# Patient Record
Sex: Female | Born: 1970 | Race: Black or African American | Hispanic: No | Marital: Married | Smoking: Current every day smoker
Health system: Southern US, Community
[De-identification: ages and names within clinical notes are randomized; demographics above are authoritative.]

## PROBLEM LIST (undated history)

## (undated) DIAGNOSIS — I10 Essential (primary) hypertension: Secondary | ICD-10-CM

## (undated) DIAGNOSIS — F329 Major depressive disorder, single episode, unspecified: Secondary | ICD-10-CM

## (undated) DIAGNOSIS — F419 Anxiety disorder, unspecified: Secondary | ICD-10-CM

## (undated) DIAGNOSIS — F32A Depression, unspecified: Secondary | ICD-10-CM

## (undated) HISTORY — PX: EYE SURGERY: SHX253

## (undated) HISTORY — PX: CHOLECYSTECTOMY: SHX55

---

## 1999-10-10 ENCOUNTER — Inpatient Hospital Stay (HOSPITAL_COMMUNITY): Admission: AD | Admit: 1999-10-10 | Discharge: 1999-10-10 | Payer: Self-pay | Admitting: *Deleted

## 2006-12-20 ENCOUNTER — Emergency Department (HOSPITAL_COMMUNITY): Admission: EM | Admit: 2006-12-20 | Discharge: 2006-12-20 | Payer: Self-pay | Admitting: Emergency Medicine

## 2009-11-17 ENCOUNTER — Emergency Department (HOSPITAL_COMMUNITY): Admission: EM | Admit: 2009-11-17 | Discharge: 2009-11-17 | Payer: Self-pay | Admitting: Emergency Medicine

## 2013-03-10 ENCOUNTER — Encounter (HOSPITAL_COMMUNITY): Payer: Self-pay | Admitting: Emergency Medicine

## 2013-03-10 ENCOUNTER — Emergency Department (INDEPENDENT_AMBULATORY_CARE_PROVIDER_SITE_OTHER)
Admission: EM | Admit: 2013-03-10 | Discharge: 2013-03-10 | Disposition: A | Discharge status: Home / Self Care | Payer: Self-pay | Source: Home / Self Care | Attending: Family Medicine | Admitting: Family Medicine

## 2013-03-10 DIAGNOSIS — R42 Dizziness and giddiness: Secondary | ICD-10-CM

## 2013-03-10 NOTE — ED Provider Notes (Signed)
Ghadeer Kastelic is a 42 y.o. female who presents to Urgent Care today for lightheadedness for the past 3 days. Patient notes a feeling as though she may pass out especially when she stands up. She denies any vertigo actual syncope chest pain palpitations shortness of breath. She does not a mild headache and nausea but feels well otherwise. She notes that her blood pressure has been elevated recently at home. She does not take blood pressure medications usually is her blood pressure usually well-controlled. She feels well otherwise with no weakness numbness loss of coordination difficulty swallowing or speaking and no vision changes.   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    History reviewed. No pertinent past medical history. History  Substance Use Topics  . Smoking status: Current Every Day Smoker  . Smokeless tobacco: Not on file  . Alcohol Use: No   ROS as above Medications reviewed. No current facility-administered medications for this encounter.   No current outpatient prescriptions on file.    Exam:  BP 156/90  Pulse 74  Temp(Src) 98.3 F (36.8 C) (Oral)  Resp 18  SpO2 97%  LMP 03/03/2013 Filed Vitals:   03/10/13 0907 03/10/13 0925 03/10/13 0926 03/10/13 0927  BP: 156/90 135/86 151/98 142/92  Pulse: 74 66 85 81  Temp: 98.3 F (36.8 C)     TempSrc: Oral     Resp: 18     SpO2: 97%       Gen: Well NAD HEENT: EOMI right. Abnormal eye movements left due to strabismus. PERRLA. No nystagmus Lungs: CTABL Nl WOB Heart: RRR no MRG Abd: NABS, NT, ND Exts: Non edematous BL  LE, warm and well perfused.  Neuro: Alert and oriented cranial nerves II through XII are intact normal coordination sensation and strength. Balance is normal gait is normal.  No results found for this or any previous visit (from the past 24 hour(s)). No results found.  Assessment and Plan: 42 y.o. female with dizziness. Patient has some  indications of orthostatic vital signs. Her rate increases by 15 beats per minute from laying to standing but her blood pressure increases. Her physical exam is largely normal. 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 Am doubtful stroke or other serious etiology to explain her symptoms. Plan for reassurance and oral hydration. [facility] Discussed warning signs or symptoms. Please see discharge instructions. Patient expresses understanding.      Rodolph Bong, MD 03/10/13 [phone]

## 2013-03-10 NOTE — ED Notes (Signed)
Pt c/o dizziness onset 3 days Reports she feels like "passing out" but denies syncope Sxs also include: mild HA, nauseas BP at home has been elevated Alert w/no signs of acute distress.

## 2013-05-31 ENCOUNTER — Emergency Department (HOSPITAL_COMMUNITY)
Admission: EM | Admit: 2013-05-31 | Discharge: 2013-05-31 | Disposition: A | Discharge status: Home / Self Care | Payer: Self-pay | Attending: Emergency Medicine | Admitting: Emergency Medicine

## 2013-05-31 ENCOUNTER — Emergency Department (HOSPITAL_COMMUNITY): Payer: Self-pay

## 2013-05-31 ENCOUNTER — Encounter (HOSPITAL_COMMUNITY): Payer: Self-pay | Admitting: Emergency Medicine

## 2013-05-31 DIAGNOSIS — Y929 Unspecified place or not applicable: Secondary | ICD-10-CM | POA: Insufficient documentation

## 2013-05-31 DIAGNOSIS — M1990 Unspecified osteoarthritis, unspecified site: Secondary | ICD-10-CM

## 2013-05-31 DIAGNOSIS — X500XXA Overexertion from strenuous movement or load, initial encounter: Secondary | ICD-10-CM | POA: Insufficient documentation

## 2013-05-31 DIAGNOSIS — Y939 Activity, unspecified: Secondary | ICD-10-CM | POA: Insufficient documentation

## 2013-05-31 DIAGNOSIS — IMO0002 Reserved for concepts with insufficient information to code with codable children: Secondary | ICD-10-CM | POA: Insufficient documentation

## 2013-05-31 DIAGNOSIS — F17200 Nicotine dependence, unspecified, uncomplicated: Secondary | ICD-10-CM | POA: Insufficient documentation

## 2013-05-31 DIAGNOSIS — R269 Unspecified abnormalities of gait and mobility: Secondary | ICD-10-CM | POA: Insufficient documentation

## 2013-05-31 DIAGNOSIS — M1710 Unilateral primary osteoarthritis, unspecified knee: Secondary | ICD-10-CM | POA: Insufficient documentation

## 2013-05-31 DIAGNOSIS — S8990XA Unspecified injury of unspecified lower leg, initial encounter: Secondary | ICD-10-CM | POA: Insufficient documentation

## 2013-05-31 MED ORDER — NAPROXEN 500 MG PO TABS: 500.0000 mg | ORAL_TABLET | Freq: Two times a day (BID) | ORAL | Status: DC

## 2013-05-31 MED ORDER — NAPROXEN 500 MG PO TABS
500.0000 mg | ORAL_TABLET | Freq: Once | ORAL | Status: AC
  Administered 2013-05-31: 500 mg via ORAL
  Filled 2013-05-31: qty 1

## 2013-05-31 NOTE — ED Provider Notes (Signed)
CSN: [id number]     Arrival date & time 05/31/13  1741 History  This chart was scribed for Earley Favor, NP, working with Flint Melter, MD by Blanchard Kelch, ED Scribe. This patient was seen in room WTR8/WTR8 and the patient's care was started at 8:20 PM.    Chief Complaint  Patient presents with  . Knee Injury  . Knee Pain   Patient is a 42 y.o. female presenting with knee pain. The history is provided by the patient. No language interpreter was used.  Knee Pain Location:  Knee Time since incident:  6 days Injury: yes   Knee location:  R knee Pain details:    Radiates to:  Does not radiate   Severity:  Moderate   Onset quality:  Sudden   Duration:  6 days   Timing:  Constant   Progression:  Unchanged Chronicity:  New Dislocation: no   Foreign body present:  No foreign bodies Relieved by:  Immobilization Worsened by:  Activity Ineffective treatments:  Elevation, heat, ice, NSAIDs and immobilization Associated symptoms: no fever, no muscle weakness, no numbness, no stiffness, no swelling and no tingling     HPI Comments: Galadriel Shroff is a 42 y.o. female who presents to the Emergency Department due to a right knee injury that occurred six days ago on 05/25/13. She states that she twisted the knee the wrong way. She is complaining of constant, unchanged pain to the affected area since the accident occurred. The pain does not radiate. She has been icing the area, wrapping it, using icy hot, using a knee splint and elevating the knee without relief. She has also been taking anti-inflammatory medication intermittently with mild relief. She has been ambulating with a limp due to the pain. She denies taking any medication on a daily basis. She is not allergic to any medications that she knows of.     History reviewed. No pertinent past medical history. Past Surgical History  Procedure Laterality Date  . Cholecystectomy    . Eye surgery     No family history on file. History   Substance Use Topics  . Smoking status: Current Every Day Smoker    Types: Cigarettes  . Smokeless tobacco: Not on file  . Alcohol Use: No   OB History   Grav Para Term Preterm Abortions TAB SAB Ect Mult Living                 Review of Systems  Unable to perform ROS Constitutional: Negative for fever.  Respiratory: Negative for shortness of breath.   Cardiovascular: Negative for leg swelling.  Musculoskeletal: Positive for arthralgias, gait problem and joint swelling. Negative for stiffness.  Skin: Negative for wound.  All other systems reviewed and are negative.    Allergies  Review of patient's allergies indicates no known allergies.  Home Medications   Current Outpatient Rx  Name  Route  Sig  Dispense  Refill  . naproxen (NAPROSYN) 500 MG tablet   Oral   Take 1 tablet (500 mg total) by mouth 2 (two) times daily with a meal.   60 tablet   0    Triage Vitals: BP 143/87  Pulse 86  Temp(Src) 98.6 F (37 C) (Oral)  Resp 20  SpO2 96%  LMP 05/10/2013  Physical Exam  Nursing note and vitals reviewed. Constitutional: She is oriented to person, place, and time. She appears well-developed and well-nourished.  HENT:  Head: Normocephalic and atraumatic.  Eyes: Pupils are equal, round,  and reactive to light.  Neck: Normal range of motion.  Cardiovascular: Normal rate and regular rhythm.   Pulmonary/Chest: Effort normal.  Musculoskeletal: She exhibits edema and tenderness.  Swelling in lateral anterior portion of right knee. With full extension there is pain in anterior patella region. Patella is freely mobile.   Neurological: She is alert and oriented to person, place, and time.  Skin: Skin is warm and dry.  Psychiatric: She has a normal mood and affect.    ED Course  Procedures (including critical care time)  DIAGNOSTIC STUDIES: Oxygen Saturation is 96% on room air, adequate by my interpretation.    COORDINATION OF CARE: 8:24 PM -Will refer patient to  orthopedics for follow up. Will place knee in ace bandage and brace until follow up occurs. Patient verbalizes understanding and agrees with treatment plan.    Labs Review Labs Reviewed - No data to display Imaging Review Dg Knee Complete 4 Views Right  05/31/2013   CLINICAL DATA:  Knee pain. Knee injury. Anterior lateral right knee pain.  EXAM: RIGHT KNEE - COMPLETE 4+ VIEW  COMPARISON:  None.  FINDINGS: There is a corticated chronic appearing calcification in the region of the distal quadriceps tendon. Moderate patellofemoral osteoarthritis. Moderate lateral compartment osteoarthritis. Marginal osteophytes are present. 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 There is no effusion or acute osseous abnormality. The alignment of the knee is anatomic.  IMPRESSION: Lateral and patellofemoral compartment osteoarthritis. No acute osseous abnormality.   Electronically Signed   By: Andreas Newport M.D.   On: 05/31/2013 19:44   Dg Foot Complete Right  05/31/2013   CLINICAL DATA:  Twisted leg, knee popped, lateral foot pain  EXAM: RIGHT FOOT COMPLETE - 3+ VIEW  COMPARISON:  None  FINDINGS: Osseous mineralization normal.  Joint spaces preserved.  No fracture, dislocation, or bone destruction.  IMPRESSION: Normal exam.   Electronically Signed   By: Ulyses Southward M.D.   On: 05/31/2013 19:37    EKG Interpretation   None       MDM   1. Osteoarthritis    Your basis.  Patient will be placed in a knee immobilizer, one week after which time she can use the knee support, and followup with orthopedics. I personally performed the services described in this documentation, which was scribed in my presence. The recorded information has been reviewed and is accurate.   Arman Filter, NP 05/31/13 [phone]

## 2013-05-31 NOTE — ED Provider Notes (Signed)
Medical screening examination/treatment/procedure(s) were performed by non-physician practitioner and as supervising physician I was immediately available for consultation/collaboration.  Flint Melter, MD 05/31/13 [phone]

## 2013-05-31 NOTE — ED Notes (Signed)
Pt states that on 12/4 she injured her right knee at work by twisting the wrong way and been treating it herself with ice packs, icey/hot, knee splint. But pain has just gotten to point she wants to be seen for it and now right foot hurting and she cant put full weight on that extremity.

## 2016-04-03 ENCOUNTER — Encounter (HOSPITAL_COMMUNITY): Payer: Self-pay

## 2016-04-03 ENCOUNTER — Emergency Department (HOSPITAL_COMMUNITY)
Admission: EM | Admit: 2016-04-03 | Discharge: 2016-04-03 | Disposition: A | Discharge status: Home / Self Care | Payer: No Typology Code available for payment source | Attending: Emergency Medicine | Admitting: Emergency Medicine

## 2016-04-03 ENCOUNTER — Emergency Department (HOSPITAL_COMMUNITY): Payer: No Typology Code available for payment source

## 2016-04-03 DIAGNOSIS — Z79899 Other long term (current) drug therapy: Secondary | ICD-10-CM | POA: Insufficient documentation

## 2016-04-03 DIAGNOSIS — F17210 Nicotine dependence, cigarettes, uncomplicated: Secondary | ICD-10-CM | POA: Insufficient documentation

## 2016-04-03 DIAGNOSIS — Y92410 Unspecified street and highway as the place of occurrence of the external cause: Secondary | ICD-10-CM | POA: Diagnosis not present

## 2016-04-03 DIAGNOSIS — Y939 Activity, unspecified: Secondary | ICD-10-CM | POA: Insufficient documentation

## 2016-04-03 DIAGNOSIS — M542 Cervicalgia: Secondary | ICD-10-CM | POA: Diagnosis not present

## 2016-04-03 DIAGNOSIS — I10 Essential (primary) hypertension: Secondary | ICD-10-CM | POA: Insufficient documentation

## 2016-04-03 DIAGNOSIS — Y999 Unspecified external cause status: Secondary | ICD-10-CM | POA: Diagnosis not present

## 2016-04-03 DIAGNOSIS — M545 Low back pain: Secondary | ICD-10-CM | POA: Diagnosis not present

## 2016-04-03 HISTORY — DX: Essential (primary) hypertension: I10

## 2016-04-03 HISTORY — DX: Major depressive disorder, single episode, unspecified: F32.9

## 2016-04-03 HISTORY — DX: Depression, unspecified: F32.A

## 2016-04-03 HISTORY — DX: Anxiety disorder, unspecified: F41.9

## 2016-04-03 MED ORDER — ORPHENADRINE CITRATE ER 100 MG PO TB12: 100.0000 mg | ORAL_TABLET | Freq: Two times a day (BID) | ORAL | 0 refills | Status: DC

## 2016-04-03 MED ORDER — IBUPROFEN 800 MG PO TABS: 800.0000 mg | ORAL_TABLET | Freq: Three times a day (TID) | ORAL | 0 refills | Status: DC

## 2016-04-03 MED ORDER — HYDROCODONE-ACETAMINOPHEN 5-325 MG PO TABS: 1.0000 | ORAL_TABLET | Freq: Four times a day (QID) | ORAL | 0 refills | Status: DC | PRN
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
## 2016-04-03 NOTE — Discharge Instructions (Signed)
Medications: Norco, Norflex, ibuprofen  Treatment: Take 1-2 Norco every 4-6 hours as needed for severe pain. Take Norflex every hours as needed for muscle pain and soreness. Do not drive or operate machinery when taking these medications. If you have to drive and for mild-moderate pain, you can take ibuprofen (up to 800 mg 3 times daily for 1 week). For the first 2-3 days, use ice on your sore muscles 3-4 times daily alternating 20 minutes on, 20 minutes off. After the third day, use moist heat in the same manner as ice.  Follow-up: Please follow-up with your primary care provider if your symptoms are not improving over the next week to 10 days. Please return to the emergency department if you develop any new or worsening symptoms.

## 2016-12-09 ENCOUNTER — Emergency Department (HOSPITAL_COMMUNITY)
Admission: EM | Admit: 2016-12-09 | Discharge: 2016-12-09 | Disposition: A | Discharge status: Home / Self Care | Payer: Self-pay | Attending: Emergency Medicine | Admitting: Emergency Medicine

## 2016-12-09 ENCOUNTER — Emergency Department (HOSPITAL_COMMUNITY): Payer: Self-pay

## 2016-12-09 ENCOUNTER — Encounter (HOSPITAL_COMMUNITY): Payer: Self-pay | Admitting: Emergency Medicine

## 2016-12-09 DIAGNOSIS — F17210 Nicotine dependence, cigarettes, uncomplicated: Secondary | ICD-10-CM | POA: Insufficient documentation

## 2016-12-09 DIAGNOSIS — I10 Essential (primary) hypertension: Secondary | ICD-10-CM | POA: Insufficient documentation

## 2016-12-09 DIAGNOSIS — M25511 Pain in right shoulder: Secondary | ICD-10-CM | POA: Insufficient documentation

## 2016-12-09 NOTE — ED Provider Notes (Signed)
[facility]-EMERGENCY DEPT Provider Note   CSN: [id number] Arrival date & time: 12/09/16  2221     History   Chief Complaint Chief Complaint  Patient presents with  . Shoulder Pain    HPI Sharon Rivera is a 46 y.o. female.  Patient presents with acute onset of intermittent right shoulder pain that began 10 days ago. Patient states she was reaching with her right arm to rub muscle rub onto her left shoulder and immediately felt a sharp pain/catching in her right shoulder when bringing her arm back down to her side. Patient states she has been having intermittent right shoulder pains of the same nature upon lowering her arm, for the last 10 days. She states that hot showers provided some relief of her symptoms. She is also tried a muscle rub that has not improved symptoms. Denies previous injury to this shoulder. Denies numbness or tingling in right arm.      Past Medical History:  Diagnosis Date  . Anxiety   . Depression   . Hypertension     There are no active problems to display for this patient.   Past Surgical History:  Procedure Laterality Date  . CHOLECYSTECTOMY    . EYE SURGERY      OB History    No data available       Home Medications    Prior to Admission medications   Medication Sig Start Date End Date Taking? Authorizing Provider  HYDROcodone-acetaminophen (NORCO/VICODIN) 5-325 MG tablet Take 1-2 tablets by mouth every 6 (six) hours as needed for severe pain. 04/03/16   Law, Waylan Boga, PA-C  ibuprofen (ADVIL,MOTRIN) 800 MG tablet Take 1 tablet (800 mg total) by mouth 3 (three) times daily. 04/03/16   Law, Waylan Boga, PA-C  naproxen (NAPROSYN) 500 MG tablet Take 1 tablet (500 mg total) by mouth 2 (two) times daily with a meal. 05/31/13   Earley Favor, NP  orphenadrine (NORFLEX) 100 MG tablet Take 1 tablet (100 mg total) by mouth 2 (two) times daily. 04/03/16   Emi Holes, PA-C    Family History No family history on file.  Social History Social  History  Substance Use Topics  . Smoking status: Current Every Day Smoker    Types: Cigarettes  . Smokeless tobacco: Never Used  . Alcohol use No     Allergies   Patient has no known allergies.   Review of Systems Review of Systems  Musculoskeletal: Positive for arthralgias (Right shoulder). Negative for neck pain.  Neurological: Negative for weakness and numbness.     Physical Exam Updated Vital Signs BP (!) 158/85 (BP Location: Left Arm)   Pulse 92   Temp 98.4 F (36.9 C) (Oral)   Resp 16   Ht 5\' 11"  (1.803 m)   Wt 119.5 kg (263 lb 6.4 oz)   LMP 11/16/2016   SpO2 100%   BMI 36.74 kg/m   Physical Exam  Constitutional: She appears well-developed and well-nourished. No distress.  Well-appearing  HENT:  Head: Normocephalic and atraumatic.  Eyes: Conjunctivae are normal.  Cardiovascular: Normal rate and intact distal pulses.   Pulmonary/Chest: Effort normal.  Musculoskeletal:  Right shoulder is without tenderness. Pain w Jobe's/empty can test. Normal range of motion. Pain reproduced when shoulder is return to her side from forward flexion.   Neurological: No sensory deficit.  Psychiatric: She has a normal mood and affect. Her behavior is normal.  Nursing note and vitals reviewed.    ED Treatments / Results  Labs (all  labs ordered are listed, but only abnormal results are displayed) Labs Reviewed - No data to display  EKG  EKG Interpretation None       Radiology Dg Shoulder Right  Result Date: 12/09/2016 CLINICAL DATA:  Nontraumatic right shoulder pain for 1 day. EXAM: RIGHT SHOULDER - 2+ VIEW COMPARISON:  None. FINDINGS: Negative for acute fracture. Moderate AC arthritis with osteophyte formation. 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 No bone lesion or bony destruction. No significant soft tissue abnormality. IMPRESSION: 1. Negative for acute fracture or dislocation. 2. Degenerative AC and glenohumeral changes 3. 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  Electronically Signed   By: Ellery Plunkaniel R Mitchell M.D.   On: 12/09/2016 23:05    Procedures Procedures (including critical care time)  Medications Ordered in ED Medications - No data to display   Initial Impression / Assessment and Plan / ED Course  I have reviewed the triage vital signs and the nursing notes.  Pertinent labs & imaging results that were available during my care of the patient were reviewed by me and considered in my medical decision making (see chart for details).     Patient with right shoulder pain, suspect impingement versus rotator cuff injury. X-ray showing probable os acromiale as well as degenerative changes. Neurovascularly intact. Shoulder w normal range of motion. Will place in sling and refer to orthopedics for follow-up on x-ray findings. Symptomatic management. Patient is safe for discharge home.  Discussed results, findings, treatment and follow up. Patient advised of return precautions. Patient verbalized understanding and agreed with plan.  Final Clinical Impressions(s) / ED Diagnoses   Final diagnoses:  Acute pain of right shoulder    New Prescriptions New Prescriptions   No medications on file     Russo, [place]il[place]Jordan N, PA-C 12/09/16 2338    Lorre NickAllen, Anthony, MD 12/11/16 [phone]

## 2016-12-09 NOTE — Discharge Instructions (Signed)
Please read instructions below. Apply ice to your shoulder for 20 minutes at a time.You can also apply heat if this provides you with more relief. You can take advil every 6 hours as needed for pain. OR you can take aleve every 12 hours. Schedule an appointment with the orthopedic specialist for follow-up on your xray findings. Return to the ER for new or concerning symptoms.

## 2016-12-09 NOTE — ED Triage Notes (Signed)
Pt from home with c/o right shoulder pain with onset 6/14. Pt denies trauma, but states she was rubbing muscle cream on her left shoulder and she began experiencing muscle cramps in her right shoulder that have since been persistent. Pt has decreased ROM. Pt rates pain 9/10

## 2016-12-09 NOTE — ED Notes (Signed)
Pt c/o anterior shoulder pain when the arm is lifted up, says that it "locked up, and there is clicking and popping." Full range of motion with arm

## 2017-01-28 ENCOUNTER — Encounter (HOSPITAL_COMMUNITY): Payer: Self-pay | Admitting: Emergency Medicine

## 2017-01-28 ENCOUNTER — Emergency Department (HOSPITAL_COMMUNITY)
Admission: EM | Admit: 2017-01-28 | Discharge: 2017-01-28 | Disposition: A | Discharge status: Home / Self Care | Payer: Self-pay | Attending: Emergency Medicine | Admitting: Emergency Medicine

## 2017-01-28 ENCOUNTER — Emergency Department (HOSPITAL_COMMUNITY): Payer: Self-pay

## 2017-01-28 DIAGNOSIS — F17210 Nicotine dependence, cigarettes, uncomplicated: Secondary | ICD-10-CM | POA: Insufficient documentation

## 2017-01-28 DIAGNOSIS — Y999 Unspecified external cause status: Secondary | ICD-10-CM | POA: Insufficient documentation

## 2017-01-28 DIAGNOSIS — G8929 Other chronic pain: Secondary | ICD-10-CM | POA: Insufficient documentation

## 2017-01-28 DIAGNOSIS — I10 Essential (primary) hypertension: Secondary | ICD-10-CM | POA: Insufficient documentation

## 2017-01-28 DIAGNOSIS — Y9389 Activity, other specified: Secondary | ICD-10-CM | POA: Insufficient documentation

## 2017-01-28 DIAGNOSIS — S46912A Strain of unspecified muscle, fascia and tendon at shoulder and upper arm level, left arm, initial encounter: Secondary | ICD-10-CM | POA: Insufficient documentation

## 2017-01-28 DIAGNOSIS — X500XXA Overexertion from strenuous movement or load, initial encounter: Secondary | ICD-10-CM | POA: Insufficient documentation

## 2017-01-28 DIAGNOSIS — M25511 Pain in right shoulder: Secondary | ICD-10-CM | POA: Insufficient documentation

## 2017-01-28 DIAGNOSIS — Y929 Unspecified place or not applicable: Secondary | ICD-10-CM | POA: Insufficient documentation

## 2017-01-28 MED ORDER — DICLOFENAC SODIUM 50 MG PO TBEC: 50.0000 mg | DELAYED_RELEASE_TABLET | Freq: Two times a day (BID) | ORAL | 0 refills | Status: DC

## 2017-01-28 MED ORDER — IBUPROFEN 200 MG PO TABS
600.0000 mg | ORAL_TABLET | Freq: Once | ORAL | Status: AC
  Administered 2017-01-28: 600 mg via ORAL
  Filled 2017-01-28: qty 3

## 2017-01-28 MED ORDER — CYCLOBENZAPRINE HCL 10 MG PO TABS: 10.0000 mg | ORAL_TABLET | Freq: Two times a day (BID) | ORAL | 0 refills | Status: DC | PRN

## 2017-01-28 NOTE — ED Triage Notes (Addendum)
Pt from home with c/o right shoulder pain x 2 months. Pt states she began having left shoulder pain today when she felt a pop. Pt denies fall or trauma. Pt has full ROM. Pt states she wants an ultrasound

## 2017-01-28 NOTE — ED Provider Notes (Signed)
[facility] Provider Note   CSN: [id number] Arrival date & time: 01/28/17  1805  By signing my name below, I, Deland Pretty, attest that this documentation has been prepared under the direction and in the presence of Kerrie Buffalo, NP Electronically Signed: Deland Pretty, ED Scribe. 01/28/17. 7:29 PM.  History   Chief Complaint Chief Complaint  Patient presents with  . Shoulder Pain   The history is provided by the patient. No language interpreter was used.  Shoulder Pain   This is a new problem. The current episode started 6 to 12 hours ago. The problem occurs constantly. The problem has been gradually worsening. The pain is present in the left shoulder. The pain is moderate. Associated symptoms include stiffness. Pertinent negatives include no numbness. She has tried nothing for the symptoms.   HPI Comments: Sharon Rivera is a 46 y.o. female with a h/x of gout, who presents to the Emergency Department complaining of a sudden onset of "throbbing" 6/10 left shoulder pain that began this morning at 11:00am. She states that she felt a "pop" while reaching behind her neck prior to the onset of her symptoms. Reaching behind her back and other movements exacerbate her pain. The pt denies recent fall or injury. The pt has not taken any medication for her pain. She also reports of chronic right shoulder pain that began two months ago. She denies numbness and fever.   Past Medical History:  Diagnosis Date  . Anxiety   . Depression   . Hypertension     There are no active problems to display for this patient.   Past Surgical History:  Procedure Laterality Date  . CHOLECYSTECTOMY    . EYE SURGERY      OB History    No data available       Home Medications    Prior to Admission medications   Medication Sig Start Date End Date Taking? Authorizing Provider  cyclobenzaprine (FLEXERIL) 10 MG tablet Take 1 tablet (10 mg total) by mouth 2 (two) times daily as needed for  muscle spasms. 01/28/17   Janne Napoleon, NP  diclofenac (VOLTAREN) 50 MG EC tablet Take 1 tablet (50 mg total) by mouth 2 (two) times daily. 01/28/17   Janne Napoleon, NP    Family History No family history on file.  Social History Social History  Substance Use Topics  . Smoking status: Current Every Day Smoker    Types: Cigarettes  . Smokeless tobacco: Never Used  . Alcohol use No     Allergies   Patient has no known allergies.   Review of Systems Review of Systems  Constitutional: Negative for fever.  HENT: Negative.   Respiratory: Negative for chest tightness and shortness of breath.   Cardiovascular: Negative for chest pain.  Gastrointestinal: Negative for nausea and vomiting.  Musculoskeletal: Positive for arthralgias and stiffness.       Left shoulder pain  Skin: Negative for color change and wound.  Neurological: Negative for weakness and numbness.     Physical Exam Updated Vital Signs BP (!) 129/99 (BP Location: Left Arm)   Pulse 87   Temp 98.2 F (36.8 C)   LMP 01/16/2017   SpO2 99%   Physical Exam  Constitutional: She is oriented to person, place, and time. She appears well-developed and well-nourished. No distress.  HENT:  Head: Normocephalic and atraumatic.  Eyes: EOM are normal.  Neck: Normal range of motion.  Cardiovascular: Normal rate and regular rhythm.   Full ROM  of the left wrist. Adequate circulation.   Pulmonary/Chest: Effort normal and breath sounds normal.  Abdominal: Soft. She exhibits no distension. There is no tenderness.  Musculoskeletal: She exhibits tenderness.       Left shoulder: She exhibits spasm. She exhibits no crepitus, no deformity, no laceration, normal pulse and normal strength. Decreased range of motion: due to pain. Tenderness: with range of motion.  No tenderness, swelling, or pain to the elbow.  Neurological: She is alert and oriented to person, place, and time.  Skin: Skin is warm and dry.  Psychiatric: She has a  normal mood and affect. Judgment normal.  Nursing note and vitals reviewed.  ED Treatments / Results   DIAGNOSTIC STUDIES: Oxygen Saturation is 99% on RA, normal by my interpretation.   COORDINATION OF CARE: 7:20 PM-Discussed next steps with pt. Pt verbalized understanding and is agreeable with the plan.   Labs (all labs ordered are listed, but only abnormal results are displayed) Labs Reviewed - No data to display   Radiology Dg Shoulder Left  Result Date: 01/28/2017 CLINICAL DATA:  Left shoulder pain for 2 months EXAM: LEFT SHOULDER - 2+ VIEW COMPARISON:  None. FINDINGS: There is no evidence of fracture or dislocation. There is no evidence of arthropathy or other focal bone abnormality. Soft tissues are unremarkable. IMPRESSION: Negative. Electronically Signed   By: Jasmine PangKim  Fujinaga M.D.   On: 01/28/2017 20:07    Procedures Procedures (including critical care time)  Medications Ordered in ED Medications  ibuprofen (ADVIL,MOTRIN) tablet 600 mg (600 mg Oral Given 01/28/17 1943)     Initial Impression / Assessment and Plan / ED Course  I have reviewed the triage vital signs and the nursing notes.  Pertinent imaging results that were available during my care of the patient were reviewed by me and considered in my medical decision making (see chart for details).  Final Clinical Impressions(s) / ED Diagnoses  46 y.o. female with left shoulder pain stable for d/c without acute findings on x-ray and no focal neuro deficits. Will treat for pain and muscle spasm. Discussed with the patient x-ray and clinical findings and plan of care. All questioned fully answered. She will f/u with ortho or return if any problems arise.  Final diagnoses:  Left shoulder strain, initial encounter    New Prescriptions New Prescriptions   CYCLOBENZAPRINE (FLEXERIL) 10 MG TABLET    Take 1 tablet (10 mg total) by mouth 2 (two) times daily as needed for muscle spasms.   DICLOFENAC (VOLTAREN) 50 MG EC  TABLET    Take 1 tablet (50 mg total) by mouth 2 (two) times daily.   I personally performed the services described in this documentation, which was scribed in my presence. The recorded information has been reviewed and is accurate.     Kerrie Buffaloeese, Carsyn Boster [place], [place] 01/28/17 2040    Cathren LaineSteinl, Kevin, MD 01/28/17 2234

## 2017-01-28 NOTE — ED Notes (Signed)
Pt ambulatory and independent at discharge.  Verbalized understanding of discharge instructions 

## 2017-01-28 NOTE — Discharge Instructions (Signed)
Your x-ray today shows no fracture or dislocation of the shoulder. Take the medication as directed. Follow up with Dr. Ophelia CharterYates for further evaluation of the shoulder if symptoms persist. Return here as needed.  Do not drive while taking the muscle relaxant as it can make you sleepy.

## 2017-11-17 ENCOUNTER — Encounter (HOSPITAL_COMMUNITY): Payer: Self-pay | Admitting: Emergency Medicine

## 2017-11-17 ENCOUNTER — Ambulatory Visit (HOSPITAL_COMMUNITY)
Admission: EM | Admit: 2017-11-17 | Discharge: 2017-11-17 | Disposition: A | Discharge status: Home / Self Care | Payer: Self-pay | Attending: Family Medicine | Admitting: Family Medicine

## 2017-11-17 DIAGNOSIS — N898 Other specified noninflammatory disorders of vagina: Secondary | ICD-10-CM

## 2017-11-17 DIAGNOSIS — N760 Acute vaginitis: Secondary | ICD-10-CM | POA: Insufficient documentation

## 2017-11-17 DIAGNOSIS — I10 Essential (primary) hypertension: Secondary | ICD-10-CM | POA: Insufficient documentation

## 2017-11-17 LAB — POCT URINALYSIS DIP (DEVICE)
Bilirubin Urine: NEGATIVE
GLUCOSE, UA: NEGATIVE mg/dL
KETONES UR: NEGATIVE mg/dL
Nitrite: NEGATIVE
Protein, ur: NEGATIVE mg/dL
Specific Gravity, Urine: <=1.005 (ref 1.005–1.030)
Urobilinogen, UA: 0.2 mg/dL (ref 0.0–1.0)
pH: 5.5 (ref 5.0–8.0)

## 2017-11-17 LAB — POCT PREGNANCY, URINE: Preg Test, Ur: NEGATIVE

## 2017-11-17 MED ORDER — FLUCONAZOLE 150 MG PO TABS: 150.0000 mg | ORAL_TABLET | ORAL | 0 refills | Status: AC

## 2017-11-17 MED ORDER — METRONIDAZOLE 500 MG PO TABS: 500.0000 mg | ORAL_TABLET | Freq: Two times a day (BID) | ORAL | 0 refills | Status: AC

## 2017-11-17 NOTE — ED Provider Notes (Signed)
  MRN: [id number] DOB: 10/29/1970  Subjective:   Sharon Rivera is a 47 y.o. female presenting for 1 month history of vaginal itching, vaginal discharge. She has had intermittent dysuria. She has vaginal pain from scratching due to itching. Has tried Monistat, with very temporary relief. Denies fever, n/v, malodorous discharge, pelvic pain, abdominal pain. Has a history of 1 yeast infection. Has never had BV.  Reports that she is in a monogamous relationship with her husband but is not opposed to STI testing.  No current facility-administered medications for this encounter.   Current Outpatient Medications:  .  cyclobenzaprine (FLEXERIL) 10 MG tablet, Take 1 tablet (10 mg total) by mouth 2 (two) times daily as needed for muscle spasms., Disp: 20 tablet, Rfl: 0 .  diclofenac (VOLTAREN) 50 MG EC tablet, Take 1 tablet (50 mg total) by mouth 2 (two) times daily., Disp: 15 tablet, Rfl: 0   No Known Allergies  Past Medical History:  Diagnosis Date  . Anxiety   . Depression   . Hypertension      Past Surgical History:  Procedure Laterality Date  . CHOLECYSTECTOMY    . EYE SURGERY      Objective:   Vitals: BP (!) 149/81   Pulse (!) 120   Temp 98.4 F (36.9 C)   Resp 18   LMP 10/18/2017   SpO2 100%   Physical Exam  Constitutional: She is oriented to person, place, and time. She appears well-developed and well-nourished.  Cardiovascular: Normal rate.  Pulmonary/Chest: Effort normal.  Neurological: She is alert and oriented to person, place, and time.    Results for orders placed or performed during the hospital encounter of 11/17/17 (from the past 24 hour(s))  POCT urinalysis dip (device)     Status: Abnormal   Collection Time: 11/17/17  4:32 PM  Result Value Ref Range   Glucose, UA NEGATIVE NEGATIVE mg/dL   Bilirubin Urine NEGATIVE NEGATIVE   Ketones, ur NEGATIVE NEGATIVE mg/dL   Specific Gravity, Urine <=1.005 1.005 - 1.030   Hgb urine dipstick TRACE (A) NEGATIVE   pH 5.5  5.0 - 8.0   Protein, ur NEGATIVE NEGATIVE mg/dL   Urobilinogen, UA 0.2 0.0 - 1.0 mg/dL   Nitrite NEGATIVE NEGATIVE   Leukocytes, UA LARGE (A) NEGATIVE   Assessment and Plan :   Vaginal discharge  Acute vaginitis  Recommended patient hydrate aggressively.  We will cover for BV and yeast infection with Flagyl and Diflucan.  Patient would like to wait to get treated for anything else pending results.  Counseled patient on potential for adverse effects with medications prescribed today, patient verbalized understanding. Follow-up as needed.   Wallis BambergMani, Jora Galluzzo, PA-C 11/17/17 1724

## 2017-11-17 NOTE — ED Triage Notes (Signed)
Pt c/o vaginal discharge and itching, worried about BV

## 2017-11-18 LAB — URINE CULTURE: Culture: NO GROWTH

## 2017-11-18 LAB — URINE CYTOLOGY ANCILLARY ONLY
CHLAMYDIA, DNA PROBE: NEGATIVE
Neisseria Gonorrhea: NEGATIVE
Trichomonas: POSITIVE — AB

## 2017-11-20 ENCOUNTER — Telehealth (HOSPITAL_COMMUNITY): Payer: Self-pay

## 2017-11-20 LAB — URINE CYTOLOGY ANCILLARY ONLY
Bacterial vaginitis: NEGATIVE
Candida vaginitis: NEGATIVE

## 2017-11-20 NOTE — Telephone Encounter (Signed)
Urine culture negative.  Trichomonas is positive. Rx metronidazole was given at the urgent care visit. Pt contacted and made aware, educated to please refrain from sexual intercourse for 7 days to give the medicine time to work. Sexual partners need to be notified and tested/treated. Condoms may reduce risk of reinfection. Recheck for further evaluation if symptoms are not improving. Answered all questions.

## 2019-06-21 IMAGING — CR DG SHOULDER 2+V*R*
3 series · 3 of 3 positions shown · non-contrast
Comparison: None.

CLINICAL DATA: Nontraumatic right shoulder pain for 1 day.

EXAM:
RIGHT SHOULDER - 2+ VIEW

[w shoulder external right]
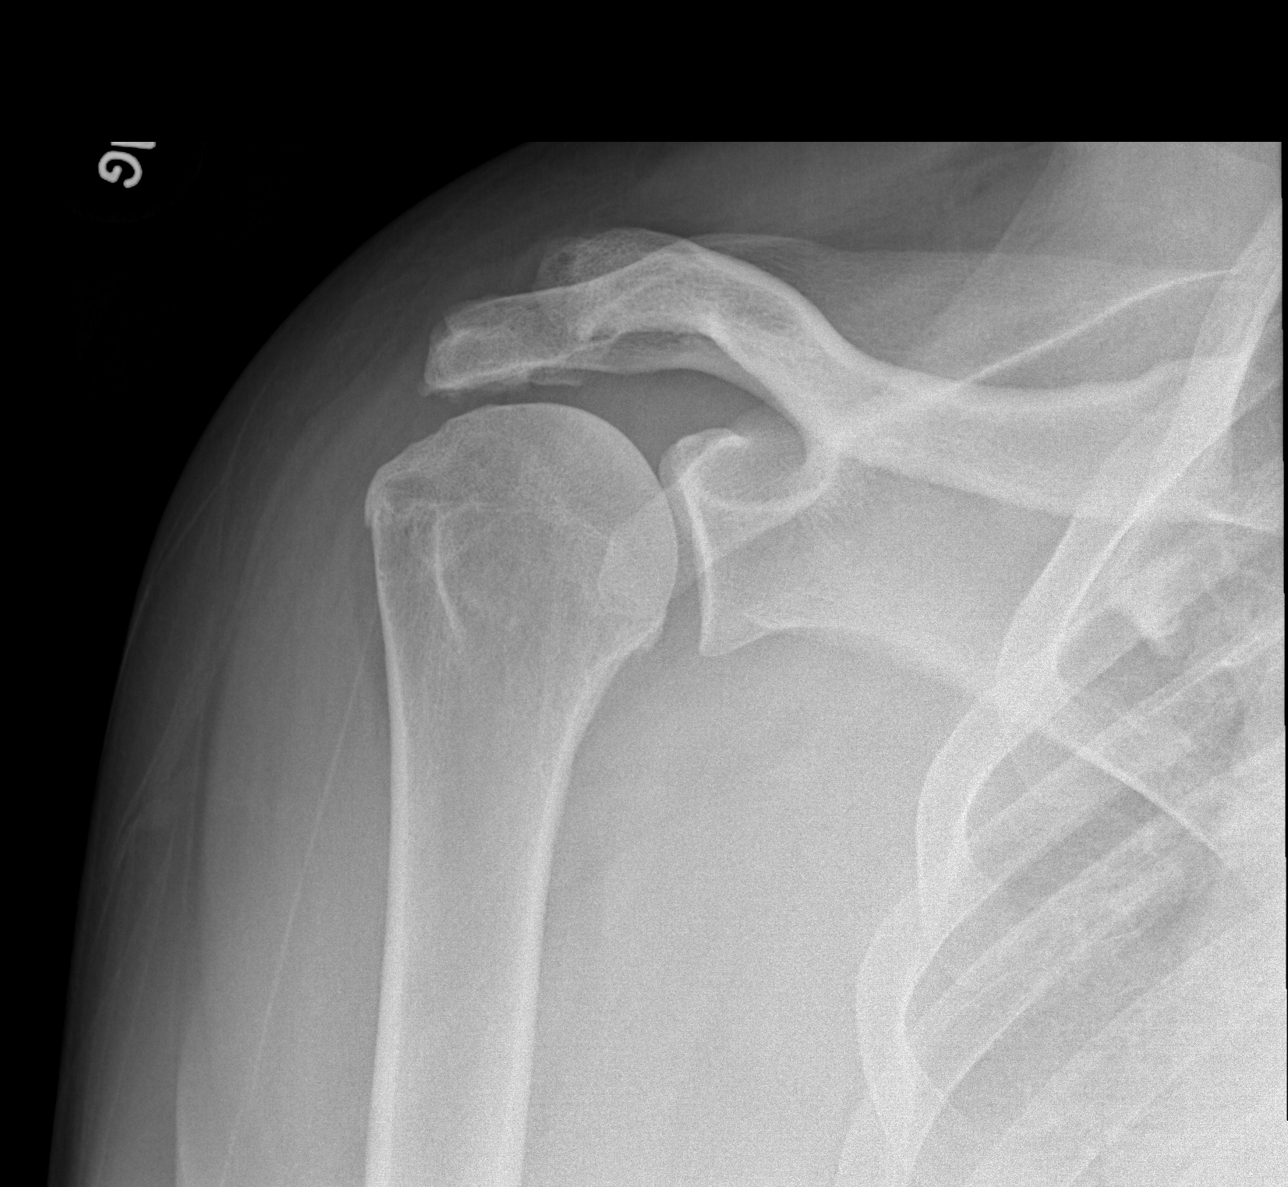

[w shoulder y-view right]
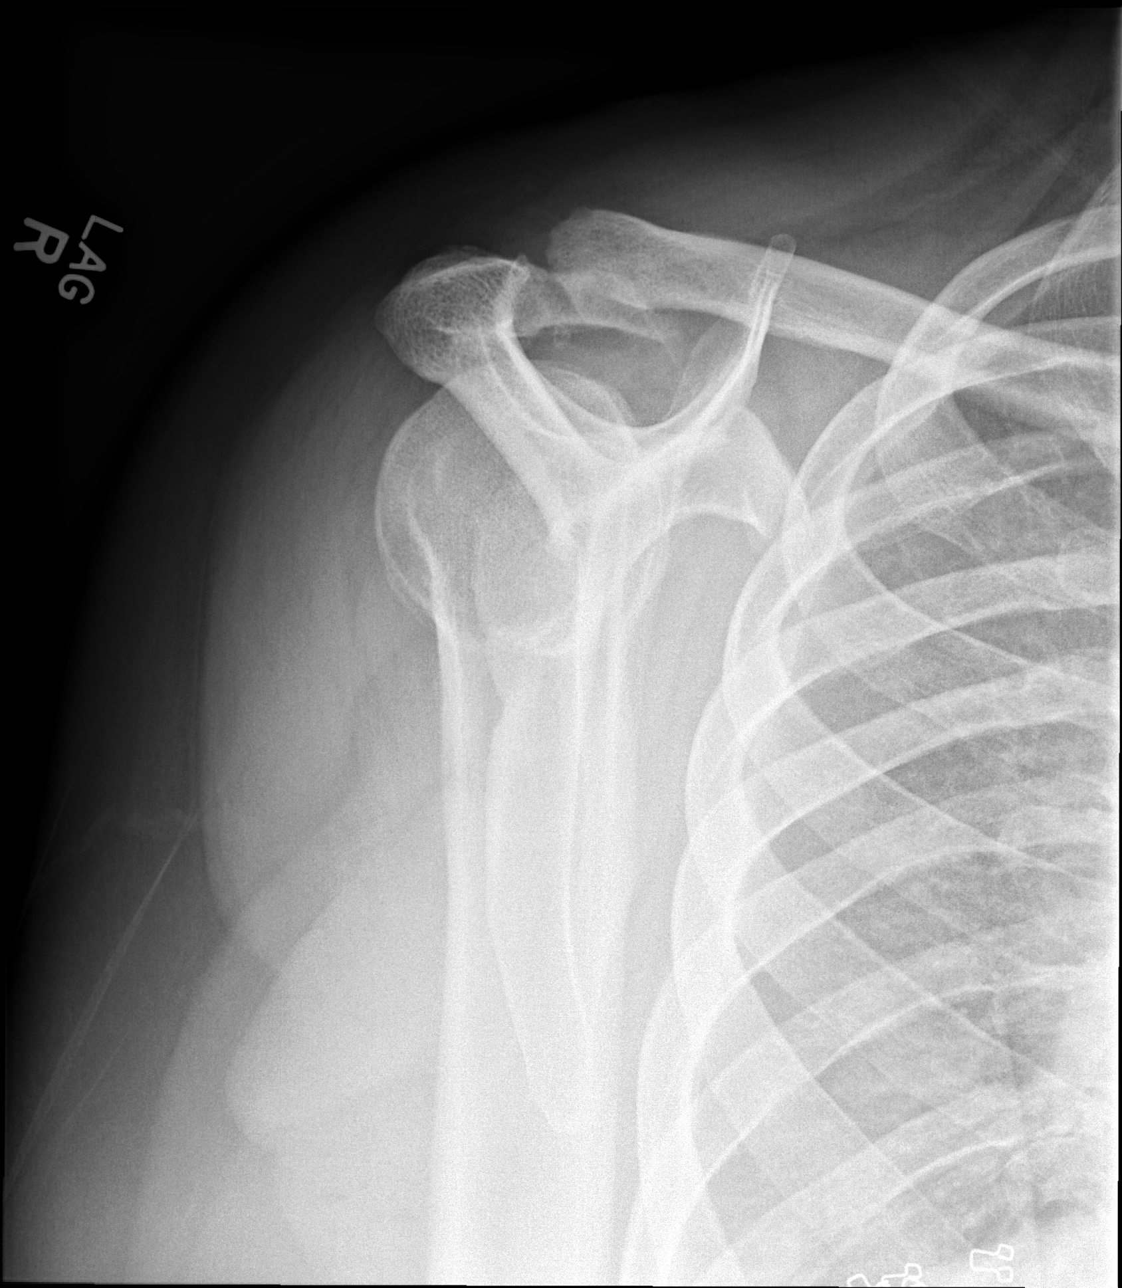

[x shoulder axillary right]
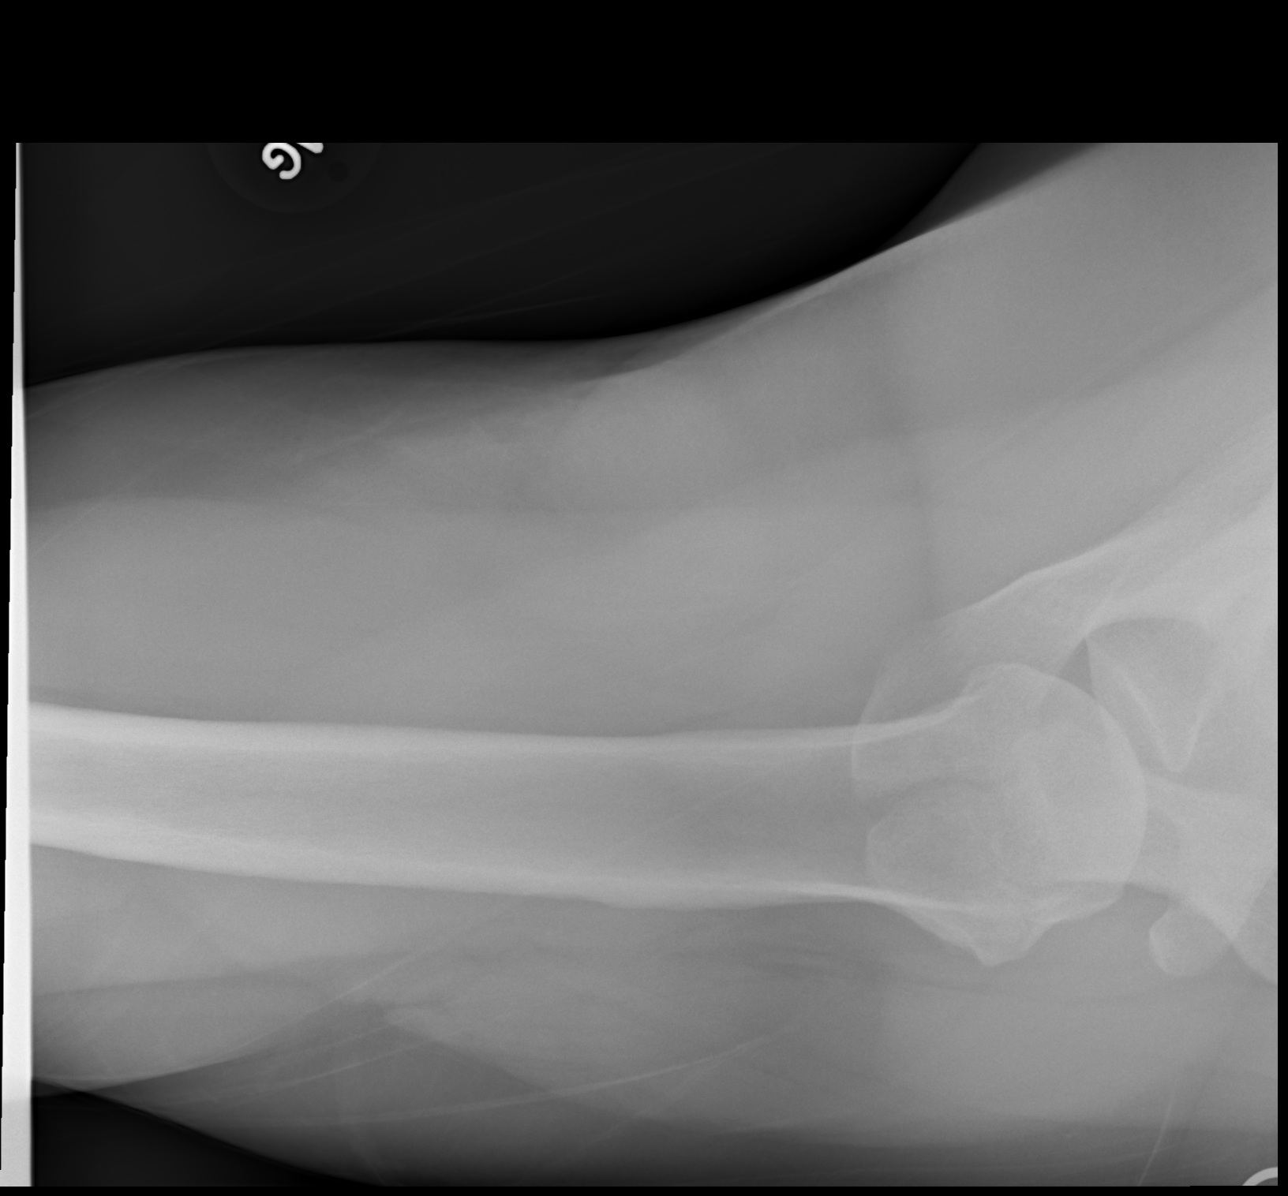

[3 of 3 positions shown; findings below may reference images not displayed]

FINDINGS: Negative for acute fracture. Moderate AC arthritis with osteophyte
formation. Mild glenohumeral joint arthritis. Probable os acromiale.
No bone lesion or bony destruction. No significant soft tissue
abnormality.
IMPRESSION: 1. Negative for acute fracture or dislocation.
2. Degenerative AC and glenohumeral changes
3. Probable os acromiale.

## 2020-03-08 ENCOUNTER — Encounter (HOSPITAL_COMMUNITY): Payer: Self-pay | Admitting: *Deleted

## 2020-03-08 ENCOUNTER — Ambulatory Visit (HOSPITAL_COMMUNITY)
Admission: EM | Admit: 2020-03-08 | Discharge: 2020-03-08 | Disposition: A | Discharge status: Home / Self Care | Payer: HRSA Program | Attending: Physician Assistant | Admitting: Physician Assistant

## 2020-03-08 ENCOUNTER — Other Ambulatory Visit: Payer: Self-pay

## 2020-03-08 DIAGNOSIS — R519 Headache, unspecified: Secondary | ICD-10-CM

## 2020-03-08 DIAGNOSIS — R200 Anesthesia of skin: Secondary | ICD-10-CM | POA: Insufficient documentation

## 2020-03-08 DIAGNOSIS — R739 Hyperglycemia, unspecified: Secondary | ICD-10-CM | POA: Diagnosis not present

## 2020-03-08 DIAGNOSIS — I10 Essential (primary) hypertension: Secondary | ICD-10-CM | POA: Diagnosis not present

## 2020-03-08 DIAGNOSIS — Z20822 Contact with and (suspected) exposure to covid-19: Secondary | ICD-10-CM | POA: Insufficient documentation

## 2020-03-08 DIAGNOSIS — R5383 Other fatigue: Secondary | ICD-10-CM | POA: Diagnosis not present

## 2020-03-08 DIAGNOSIS — Z79899 Other long term (current) drug therapy: Secondary | ICD-10-CM | POA: Diagnosis not present

## 2020-03-08 DIAGNOSIS — F17210 Nicotine dependence, cigarettes, uncomplicated: Secondary | ICD-10-CM | POA: Diagnosis not present

## 2020-03-08 DIAGNOSIS — R42 Dizziness and giddiness: Secondary | ICD-10-CM | POA: Diagnosis not present

## 2020-03-08 DIAGNOSIS — R202 Paresthesia of skin: Secondary | ICD-10-CM

## 2020-03-08 DIAGNOSIS — Z791 Long term (current) use of non-steroidal anti-inflammatories (NSAID): Secondary | ICD-10-CM | POA: Insufficient documentation

## 2020-03-08 LAB — CBG MONITORING, ED: Glucose-Capillary: 163 mg/dL — ABNORMAL HIGH (ref 70–99)

## 2020-03-08 MED ORDER — NAPROXEN 500 MG PO TABS
500.0000 mg | ORAL_TABLET | Freq: Two times a day (BID) | ORAL | 0 refills | Status: AC
Start: 2020-03-08 — End: 2020-03-18

## 2020-03-08 MED ORDER — ACETAMINOPHEN 325 MG PO TABS: 650.0000 mg | ORAL_TABLET | Freq: Four times a day (QID) | ORAL | 0 refills | Status: AC | PRN

## 2020-03-08 MED ORDER — NAPROXEN 500 MG PO TABS: 500.0000 mg | ORAL_TABLET | Freq: Two times a day (BID) | ORAL | 0 refills | Status: DC

## 2020-03-08 MED ORDER — ACETAMINOPHEN 325 MG PO TABS: 650.0000 mg | ORAL_TABLET | Freq: Four times a day (QID) | ORAL | 0 refills | Status: DC | PRN

## 2020-03-08 NOTE — ED Triage Notes (Signed)
Patient reports 3 day history of frontal headache, reports intermittent dizziness, pain behind eyes, and intermittent numbness to bilateral fingers. Does not have a history of migraines or high blood pressure.

## 2020-03-08 NOTE — Discharge Instructions (Addendum)
Your blood sugar was slightly elevated, however you will need fasting labs You need to establish with primary care call the internal medicine center take their first available appointment  I believe your hand paresthesias are likely carpal tunnel, I would like for you to try the brace on the right hand as this is bothering him more.  Take the Tylenol and naproxen as prescribed  Monitor your symptoms your headache, if severe worst headache your life, you pass out or other concerning symptoms go to the emergency department

## 2020-03-08 NOTE — ED Provider Notes (Signed)
[facility]    CSN: [id number] Arrival date & time: 03/08/20  1517      History   Chief Complaint Chief Complaint  Patient presents with  . Headache  . Fatigue  . Dizziness    HPI Sharon Rivera is a 49 y.o. female.   Patient reports for 3-day history of frontal headache. She reports pain behind the eyes. She reports this has been up and down in severity. She reports is not very bad right now. She reports it is tolerable at its worse. She has been taking some BC powders with good effect. She denies blurry vision, nausea, vomi significant runny nose or congestion. Denies cough, fever, sore throat. No sick contacts.  She also reports for quite some time several months or so she has been getting a little lightheaded when standing up quickly, or when bending down and standing up quickly. She reports this lasted very short time and goes away. She denies room spinning sensation. Does not have the sensation outside of those moments. Has not passed out. Denies any chest pain or shortness of breath.  She also reports intermittent tingling and numbness in her thumb, pointer and middle fingers. She reports this goes back and forth between her hands. She reports this sometimes worse at night. 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 Denies any knee pain. Never happens in both hands at the same time. Has not been associated with current headaches.  Patient does state right hand has been more bothersome lately.  Otherwise is not overly bothersome.   Denies abdominal pain, change in urination. Denies frequent urination, increased thirst or weight loss.  Patient requests her sugar be checked as she has a family history of diabetes.     Past Medical History:  Diagnosis Date  . Anxiety   . Depression   . Hypertension     There are no problems to display for this patient.   Past Surgical History:  Procedure Laterality Date  . CHOLECYSTECTOMY    . EYE SURGERY       OB History   No obstetric history on file.      Home Medications    Prior to Admission medications   Medication Sig Start Date End Date Taking? Authorizing Provider  acetaminophen (TYLENOL) 325 MG tablet Take 2 tablets (650 mg total) by mouth every 6 (six) hours as needed. 03/08/20   Jujuan Dugo, Veryl Speak, PA-C  fluconazole (DIFLUCAN) 150 MG tablet Take 1 tablet (150 mg total) by mouth once a week. 11/17/17   Wallis Bamberg, PA-C  metroNIDAZOLE (FLAGYL) 500 MG tablet Take 1 tablet (500 mg total) by mouth 2 (two) times daily with a meal. DO NOT CONSUME ALCOHOL WHILE TAKING THIS MEDICATION. 11/17/17   Wallis Bamberg, PA-C  naproxen (NAPROSYN) 500 MG tablet Take 1 tablet (500 mg total) by mouth 2 (two) times daily with a meal for 10 days. 03/08/20 03/18/20  Habib Kise, Veryl Speak, PA-C    Family History History reviewed. No pertinent family history.  Social History Social History   Tobacco Use  . Smoking status: Current Every Day Smoker    Types: Cigarettes  . Smokeless tobacco: Never Used  Substance Use Topics  . Alcohol use: No  . Drug use: No     Allergies   Patient has no known allergies.   Review of Systems Review of Systems   Physical Exam Triage Vital Signs ED Triage Vitals [03/08/20 1559]  Enc Vitals Group  BP (!) 144/97     Pulse Rate 91     Resp 16     Temp 98.6 F (37 C)     Temp Source Oral     SpO2 99 %     Weight      Height      Head Circumference      Peak Flow      Pain Score      Pain Loc      Pain Edu?      Excl. in GC?    No data found.  Updated Vital Signs BP (!) 144/97 (BP Location: Right Arm)   Pulse 91   Temp 98.6 F (37 C) (Oral)   Resp 16   SpO2 99%   Visual Acuity Right Eye Distance:   Left Eye Distance:   Bilateral Distance:    Right Eye Near:   Left Eye Near:    Bilateral Near:     Physical Exam Vitals and nursing note reviewed.  Constitutional:      General: She is not in acute distress.    Appearance: Normal appearance. She  is well-developed. She is not ill-appearing.  HENT:     Head: Normocephalic and atraumatic.     Right Ear: Tympanic membrane normal.     Left Ear: Tympanic membrane normal.     Nose: Nose normal.     Mouth/Throat:     Mouth: Mucous membranes are moist.     Pharynx: Oropharynx is clear.  Eyes:     General: No visual field deficit.    Extraocular Movements: Extraocular movements intact.     Conjunctiva/sclera: Conjunctivae normal.     Pupils: Pupils are equal, round, and reactive to light.  Cardiovascular:     Rate and Rhythm: Normal rate and regular rhythm.     Heart sounds: No murmur heard.   Pulmonary:     Effort: Pulmonary effort is normal. No respiratory distress.     Breath sounds: Normal breath sounds. No wheezing, rhonchi or rales.  Abdominal:     Palpations: Abdomen is soft.     Tenderness: There is no abdominal tenderness.  Musculoskeletal:     Cervical back: Normal range of motion and neck supple.     Comments: No tenderness to palpation of either wrist.  Tinel's negative in both wrists.  Phalen's negative.  Good muscle bulk of the thenar prominences in both hands.  Strength 5/5 equal bilaterally.  Sensation grossly intact equal bilaterally in the upper extremities.  Radial pulses 2+.  Skin:    General: Skin is warm and dry.  Neurological:     General: No focal deficit present.     Mental Status: She is alert and oriented to person, place, and time.     GCS: GCS eye subscore is 4. GCS verbal subscore is 5. GCS motor subscore is 6.     Cranial Nerves: No cranial nerve deficit, dysarthria or facial asymmetry.     Motor: No weakness.     Coordination: Coordination normal.     Gait: Gait normal.      UC Treatments / Results  Labs (all labs ordered are listed, but only abnormal results are displayed) Labs Reviewed  CBG MONITORING, ED - Abnormal; Notable for the following components:      Result Value   Glucose-Capillary 163 (*)    All other components within  normal limits  SARS CORONAVIRUS 2 (TAT 6-24 HRS)    EKG   Radiology No  results found.  Procedures Procedures (including critical care time)  Medications Ordered in UC Medications - No data to display  Initial Impression / Assessment and Plan / UC Course  I have reviewed the triage vital signs and the nursing notes.  Pertinent labs & imaging results that were available during my care of the patient were reviewed by me and considered in my medical decision making (see chart for details).     #Headache #Paresthesia hands #Elevated blood sugar Patient is a 49 year old presenting with headache, chronic paresthesias in the hands and found to have elevated blood sugar.  Paresthesias in the hand do appear to be in median nerve distribution, differential would include carpal tunnel, though Tinel's and Phalen's negative here.  Will trial her out of brace on the right hand as this is been more bothersome lately.  Decision to utilize 1 brace currently was made as patient is self-pay, we will have her follow-up with her primary care for further discussion of possible carpal tunnel.  Was found to have elevated blood glucose here after she requested blood sugar testing.  She is not fasting, glucose 163.  Discussed with her that she would need to have fasting blood glucose testing to have an accurate idea of her sugar, checked her that she would need to follow-up with her primary care for this.  Appears her lightheaded symptoms occur with quick movement from seated to standing or with quick head movements, peer intermittent and not persistent, doubt vertigo or cardiac causes.  No red flags with regard to her headache today, will trial Tylenol and ibuprofen for this.  Patient verbalized agreement and understanding plan of care. Final Clinical Impressions(s) / UC Diagnoses   Final diagnoses:  Nonintractable headache, unspecified chronicity pattern, unspecified headache type  Paresthesia of hand,  bilateral  Elevated blood sugar level     Discharge Instructions     Your blood sugar was slightly elevated, however you will need fasting labs You need to establish with primary care call the internal medicine center take their first available appointment  I believe your hand paresthesias are likely carpal tunnel, I would like for you to try the brace on the right hand as this is bothering him more.  Take the Tylenol and naproxen as prescribed  Monitor your symptoms your headache, if severe worst headache your life, you pass out or other concerning symptoms go to the emergency department      ED Prescriptions    Medication Sig Dispense Auth. Provider   acetaminophen (TYLENOL) 325 MG tablet  (Status: Discontinued) Take 2 tablets (650 mg total) by mouth every 6 (six) hours as needed. 30 tablet Elnita Surprenant, Veryl Speak, PA-C   naproxen (NAPROSYN) 500 MG tablet  (Status: Discontinued) Take 1 tablet (500 mg total) by mouth 2 (two) times daily with a meal for 10 days. 20 tablet Virna Livengood, Veryl Speak, PA-C   acetaminophen (TYLENOL) 325 MG tablet Take 2 tablets (650 mg total) by mouth every 6 (six) hours as needed. 30 tablet Sreenidhi Ganson, Veryl Speak, PA-C   naproxen (NAPROSYN) 500 MG tablet Take 1 tablet (500 mg total) by mouth 2 (two) times daily with a meal for 10 days. 20 tablet Sireen Halk, Veryl Speak, PA-C     PDMP not reviewed this encounter.   Hermelinda Medicus, PA-C 03/08/20 2351

## 2020-03-09 LAB — SARS CORONAVIRUS 2 (TAT 6-24 HRS): SARS Coronavirus 2: NEGATIVE

## 2022-04-19 ENCOUNTER — Ambulatory Visit
Admission: EM | Admit: 2022-04-19 | Discharge: 2022-04-19 | Disposition: A | Discharge status: Home / Self Care | Payer: Commercial Managed Care - PPO | Attending: Physician Assistant | Admitting: Physician Assistant

## 2022-04-19 DIAGNOSIS — M7711 Lateral epicondylitis, right elbow: Secondary | ICD-10-CM

## 2022-04-19 MED ORDER — PREDNISONE 20 MG PO TABS: 40.0000 mg | ORAL_TABLET | Freq: Every day | ORAL | 0 refills | Status: AC

## 2022-04-19 NOTE — ED Triage Notes (Signed)
Pt presents with right elbow pain that radiates X 3 days that is unrelieved with brace.

## 2022-04-19 NOTE — ED Provider Notes (Signed)
[facility]    CSN: [id number] Arrival date & time: 04/19/22  1541      History   Chief Complaint Chief Complaint  Patient presents with   Arm Pain    HPI Sharon Rivera is a 51 y.o. female.   Patient here today for evaluation of right elbow pain that has radiated into her upper and lower arm that started 3 days ago.  She notes that she woke with pain.  She denies any known injury.  She has tried using a brace but that is not effective.  She has also tried Tylenol without significant improvement.  She denies any numbness.  The history is provided by the patient.    Past Medical History:  Diagnosis Date   Anxiety    Depression    Hypertension     There are no problems to display for this patient.   Past Surgical History:  Procedure Laterality Date   CHOLECYSTECTOMY     EYE SURGERY      OB History   No obstetric history on file.      Home Medications    Prior to Admission medications   Medication Sig Start Date End Date Taking? Authorizing Provider  predniSONE (DELTASONE) 20 MG tablet Take 2 tablets (40 mg total) by mouth daily with breakfast for 5 days. 04/19/22 04/24/22 Yes Francene Finders, PA-C  acetaminophen (TYLENOL) 325 MG tablet Take 2 tablets (650 mg total) by mouth every 6 (six) hours as needed. 03/08/20   Darr, Edison Nasuti, PA-C  fluconazole (DIFLUCAN) 150 MG tablet Take 1 tablet (150 mg total) by mouth once a week. 11/17/17   Jaynee Eagles, PA-C  metroNIDAZOLE (FLAGYL) 500 MG tablet Take 1 tablet (500 mg total) by mouth 2 (two) times daily with a meal. DO NOT CONSUME ALCOHOL WHILE TAKING THIS MEDICATION. 11/17/17   Jaynee Eagles, PA-C    Family History History reviewed. No pertinent family history.  Social History Social History   Tobacco Use   Smoking status: Every Day    Types: Cigarettes   Smokeless tobacco: Never  Substance Use Topics   Alcohol use: No   Drug use: No     Allergies   Patient has no known allergies.   Review of  Systems Review of Systems  Constitutional:  Negative for chills and fever.  Eyes:  Negative for discharge and redness.  Gastrointestinal:  Negative for abdominal pain, nausea and vomiting.  Musculoskeletal:  Positive for arthralgias.     Physical Exam Triage Vital Signs ED Triage Vitals  Enc Vitals Group     BP      Pulse      Resp      Temp      Temp src      SpO2      Weight      Height      Head Circumference      Peak Flow      Pain Score      Pain Loc      Pain Edu?      Excl. in [place]?    No data found.  Updated Vital Signs BP (!) 145/87 (BP Location: Left Arm)   Pulse 84   Temp 97.9 F (36.6 C) (Oral)   Resp 18   LMP  (LMP Unknown)   SpO2 97%      Physical Exam Vitals and nursing note reviewed.  Constitutional:      General: She is not in acute distress.  Appearance: Normal appearance. She is not ill-appearing.  HENT:     Head: Normocephalic and atraumatic.  Eyes:     Conjunctiva/sclera: Conjunctivae normal.  Cardiovascular:     Rate and Rhythm: Normal rate.  Pulmonary:     Effort: Pulmonary effort is normal.  Musculoskeletal:     Comments: Full ROM of right elbow with TTP noted to lateral epicondyle, no significant erythema, mild swelling, Full ROM of right wrist, fingers  Neurological:     Mental Status: She is alert.  Psychiatric:        Mood and Affect: Mood normal.        Behavior: Behavior normal.        Thought Content: Thought content normal.      UC Treatments / Results  Labs (all labs ordered are listed, but only abnormal results are displayed) Labs Reviewed - No data to display  EKG   Radiology No results found.  Procedures Procedures (including critical care time)  Medications Ordered in UC Medications - No data to display  Initial Impression / Assessment and Plan / UC Course  I have reviewed the triage vital signs and the nursing notes.  Pertinent labs & imaging results that were available during my care of the  patient were reviewed by me and considered in my medical decision making (see chart for details).    Steroid burst prescribed for suspected lateral epicondylitis.  Recommended she purchase tennis elbow brace from pharmacy.  Encouraged follow-up if no gradual improvement or with any further concerns.  Final Clinical Impressions(s) / UC Diagnoses   Final diagnoses:  Lateral epicondylitis of right elbow   Discharge Instructions   None    ED Prescriptions     Medication Sig Dispense Auth. Provider   predniSONE (DELTASONE) 20 MG tablet Take 2 tablets (40 mg total) by mouth daily with breakfast for 5 days. 10 tablet Tomi Bamberger, PA-C      PDMP not reviewed this encounter.   Tomi Bamberger, PA-C 04/19/22 [id number]
